# Patient Record
Sex: Male | Born: 1990 | Race: White | Hispanic: No | Marital: Single
Health system: Southern US, Community
[De-identification: ages and names within clinical notes are randomized; demographics above are authoritative.]

---

## 2010-02-25 ENCOUNTER — Ambulatory Visit: Payer: Self-pay | Admitting: Family Medicine

## 2010-06-01 ENCOUNTER — Ambulatory Visit: Payer: Self-pay | Admitting: Family Medicine

## 2010-10-17 ENCOUNTER — Ambulatory Visit: Payer: Self-pay | Admitting: Family Medicine

## 2011-05-21 ENCOUNTER — Ambulatory Visit: Payer: Self-pay | Admitting: Internal Medicine

## 2011-05-24 ENCOUNTER — Ambulatory Visit: Payer: Self-pay | Admitting: Internal Medicine

## 2011-09-30 ENCOUNTER — Ambulatory Visit: Payer: Self-pay | Admitting: Family Medicine

## 2011-10-19 ENCOUNTER — Ambulatory Visit: Payer: Self-pay | Admitting: Family Medicine

## 2012-01-13 ENCOUNTER — Ambulatory Visit: Payer: Self-pay | Admitting: Otolaryngology

## 2012-04-14 ENCOUNTER — Ambulatory Visit: Payer: Self-pay | Admitting: Family Medicine

## 2012-04-19 ENCOUNTER — Ambulatory Visit: Payer: Self-pay | Admitting: Family Medicine

## 2013-05-22 IMAGING — CR RIGHT FOOT COMPLETE - 3+ VIEW
1 series · 3 of 3 positions shown · non-contrast
Comparison: none

REASON FOR EXAM: [employer] Player w/right 4th metatarsal pain Eval
for stress fx
COMMENTS:

PROCEDURE:     KDR - KDXR FOOT RT COMPLETE W/OBLIQUES  - April 14, 2012  [DATE]
RESULT:     Comparison:  02/25/2010

[Series 1: ap · 0.17mm/px · 3 of 3 slices shown]
[im 1/3]
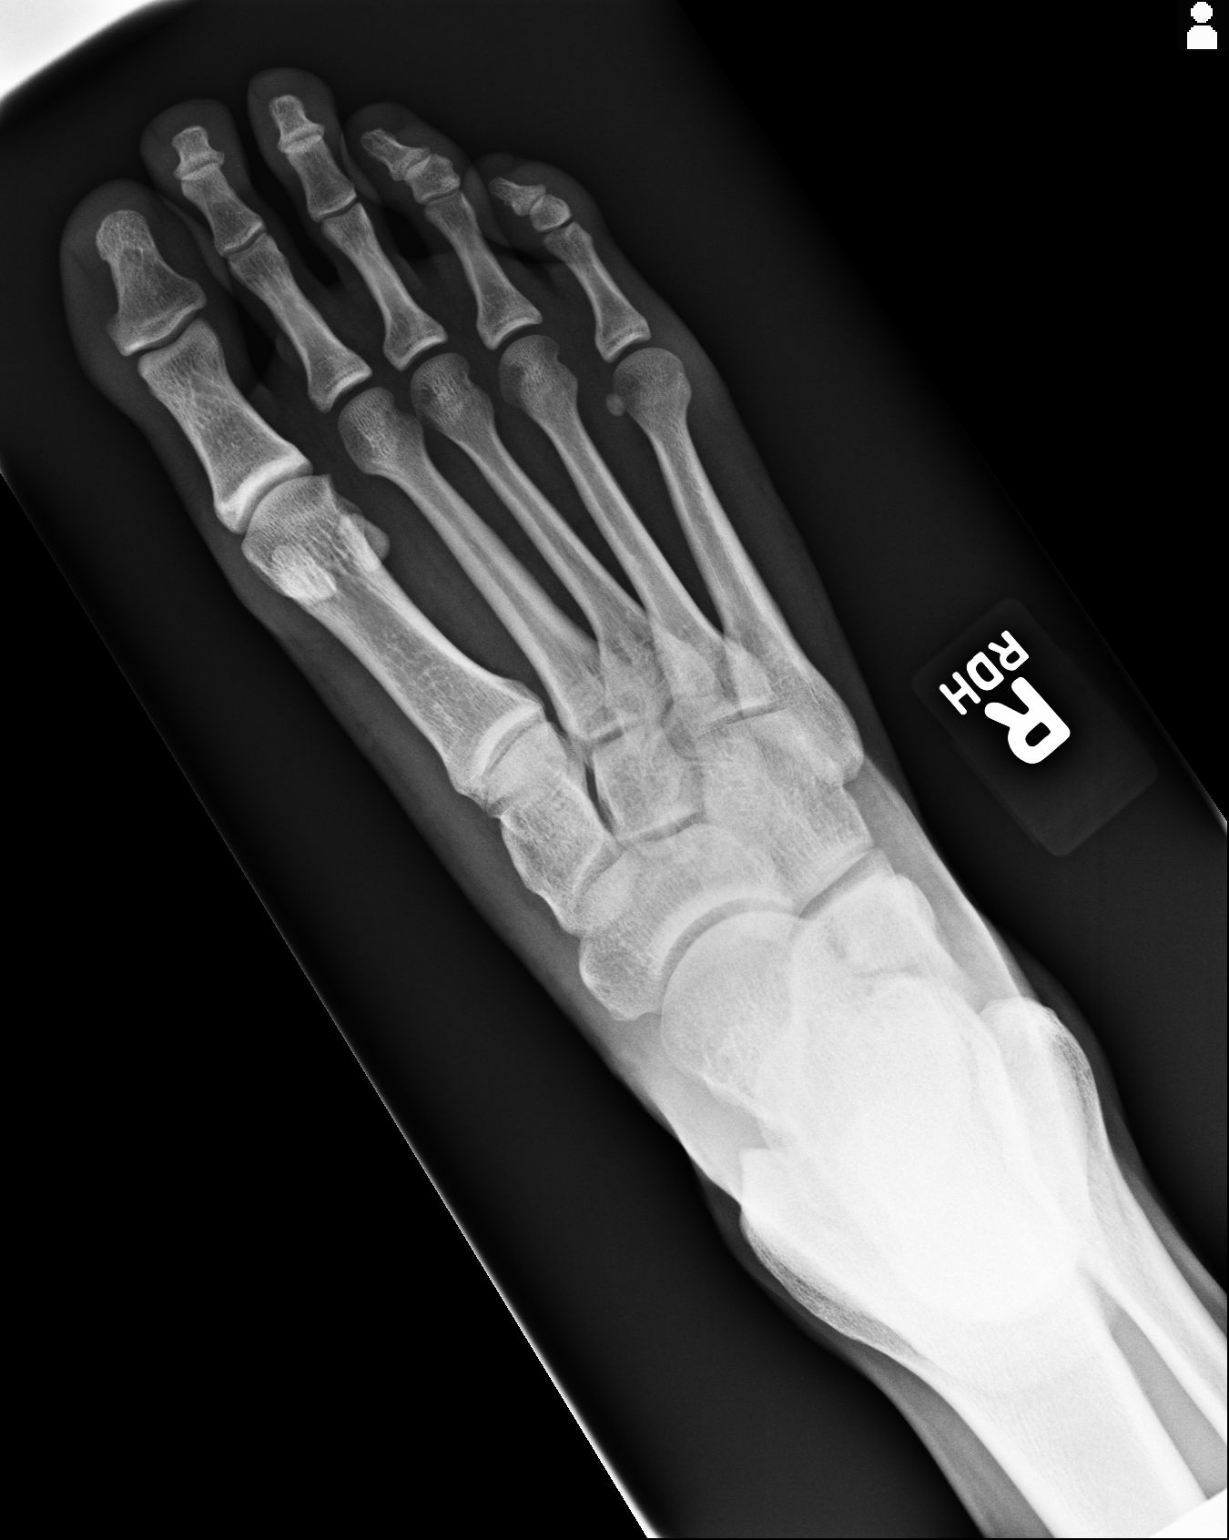
[im 2/3]
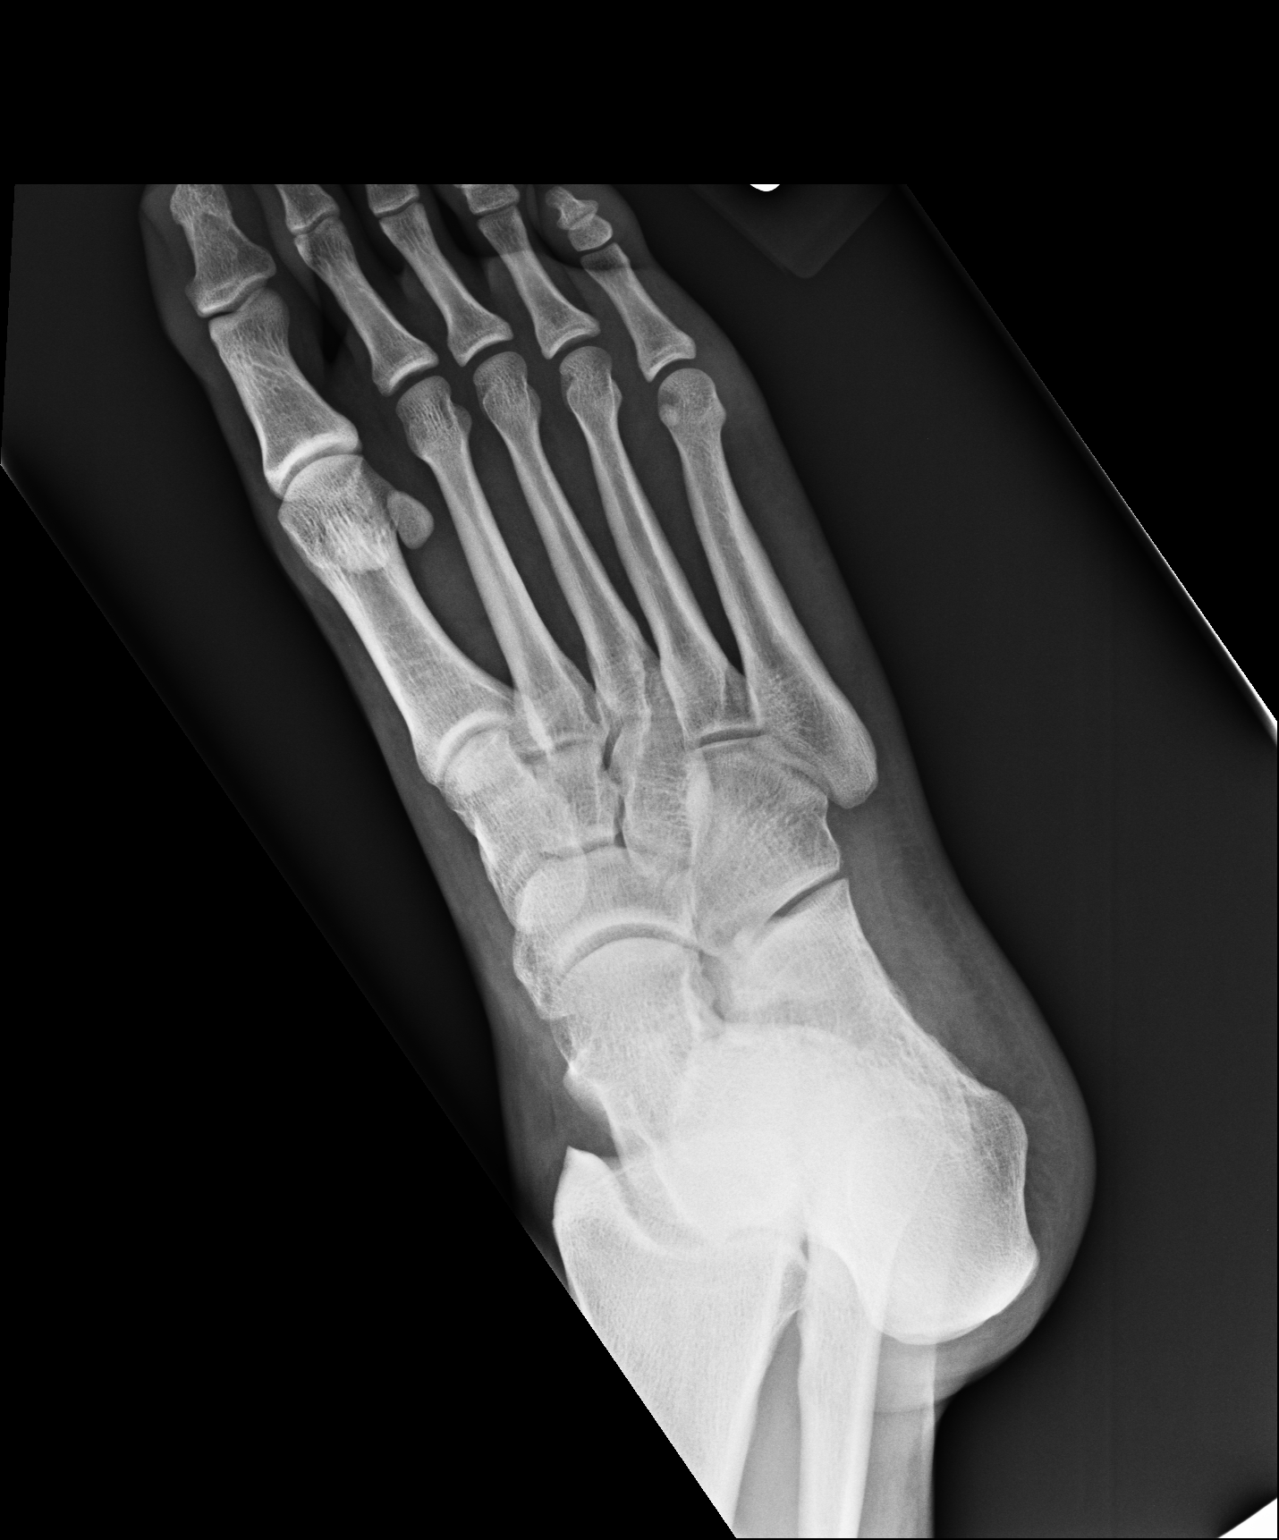
[im 3/3]
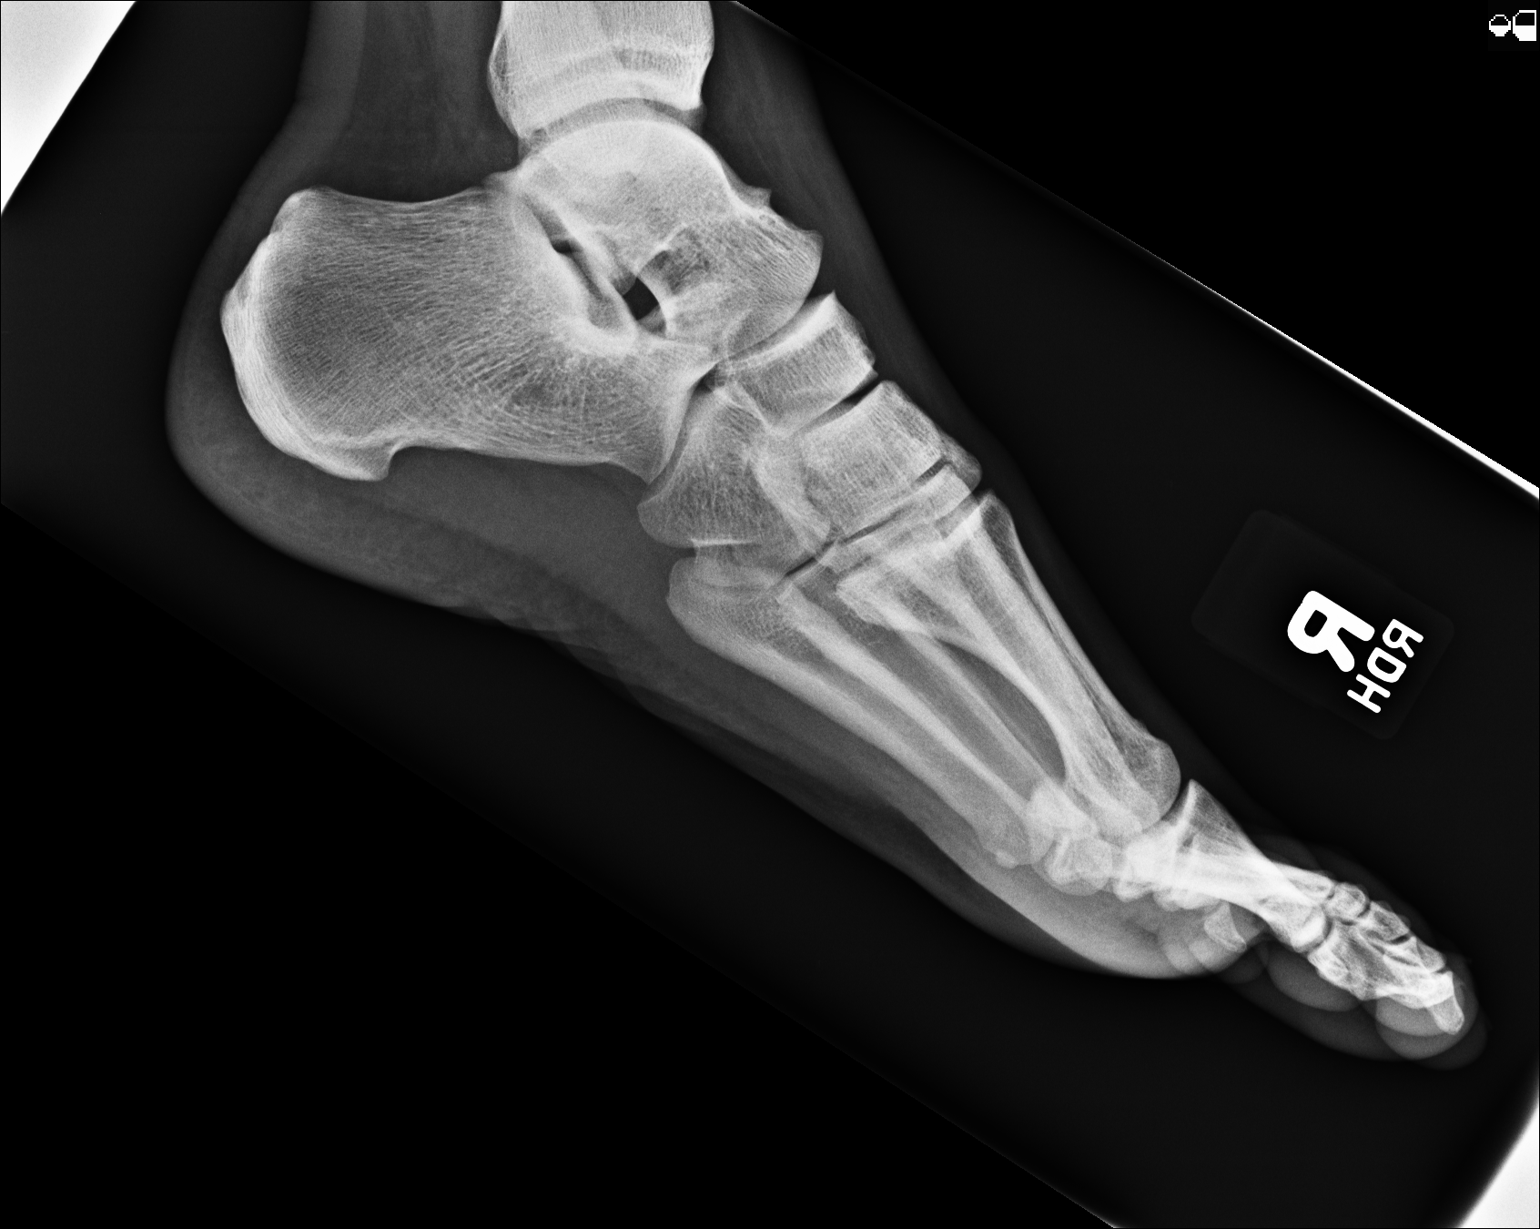

[3 of 3 positions shown; findings below may reference images not displayed]

FINDINGS: AP, oblique, and lateral views of the right foot demonstrates no fracture or
dislocation. There is no soft tissue abnormality. There is no subcutaneous
emphysema or radiopaque foreign bodies.
IMPRESSION: No acute osseous injury of the right foot.

[REDACTED]

## 2013-11-22 ENCOUNTER — Ambulatory Visit: Payer: Self-pay | Admitting: Family Medicine

## 2014-11-10 NOTE — Op Note (Signed)
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  PROCEDURES:  1. Septoplasty.  2. Bilateral submucous resection of the inferior turbinates.   SURGEON: Zackery BarefootJ. Madison Seattle Dalporto, MD   ANESTHESIA:  General  OPERATIVE FINDINGS: There was a large left inferior septal spur. The septum was curved back to the right more posteriorly and superiorly. The inferior turbinates were severely hypertrophied.   DESCRIPTION OF PROCEDURE:  The patient was identified in the holding area and was brought back to the operating room in the supine position on the operating room table.  After general endotracheal anesthesia had been induced the patient was turned 90 degrees counter clockwise from anesthesia.  The nose was anesthetized with infraorbital nerve blocks and septal injection with 0.5% Lidocaine and 0.25% Bupivacaine mixed with 1:150,000 with Epinephrine and phenylephrine Lidocaine soaked pledgets, two on each side were placed and the face was prepped and draped in the usual fashion.  The pledgets were removed.  A 15 blade was used to make a left-sided hemitransfixion incision and septal mucoperichondrial mucoperiosteal leaflets elevated.  There was a large inferior spur that was resected with Jansen-Middleton forceps.  The remaining septum was deviated back and forth in an accordion like fashion.  The bony cartilaginous junction was then divided and a moderate amount of vomer and perpendicular plate was taken down with Jansen-Middleton forceps, releasing the tension on the remaining septum.  The septum then swung back into the midline.  The septal leaflets were closed with quilting 4-0 chromic suture.  The left sided  hemitransfixion incision was closed with 4-0 plain gut.  Attention was directed to the turbinates which had been previously injected on the left.  The head of the inferior turbinate on the left was incised with a 15 blade and the medial mucoperiosteum was elevated using a Risk analystCottle elevator.  Once this had been elevated Knight scissors were used to resect the conchal bone and lateral mucoperiosteum.  The inferior margin of the remaining mucoperiosteum was then cauterized with suction cautery and FloSeal was placed at the inferior to the inferior margin of the remaining inferior turbinate.  An identical procedure was performed on the right inferior turbinate with once again placement of FloSeal along its inferior margin.  Temporary Telfa pledgets were then placed. A total of 1 unit of FloSeal was used. The patient was allowed to emerge from anesthesia, extubated in the operating room and taken to the recovery room in stable condition.  There were no complications.  Estimated blood loss was less than 10 mL.  ____________________________ J. Gertie BaronMadison Zykeem Bauserman, MD [email or web address] D: 01/13/2012 18:07:17 ET T: 01/14/2012 09:17:29 ET JOB#: [id number]  cc: Zackery BarefootJ. Madison Alexyss Balzarini, MD, <Dictator> Wendee CoppJMADISON Adien Kimmel MD ELECTRONICALLY SIGNED 01/27/2012 22:08
